# Patient Record
Sex: Male | Born: 1990 | Race: White | Hispanic: No | Marital: Single | Smoking: Never smoker
Health system: Southern US, Community
[De-identification: ages and names within clinical notes are randomized; demographics above are authoritative.]

## PROBLEM LIST (undated history)

## (undated) DIAGNOSIS — Z8619 Personal history of other infectious and parasitic diseases: Secondary | ICD-10-CM

## (undated) DIAGNOSIS — J45909 Unspecified asthma, uncomplicated: Secondary | ICD-10-CM

## (undated) HISTORY — DX: Unspecified asthma, uncomplicated: J45.909

## (undated) HISTORY — DX: Personal history of other infectious and parasitic diseases: Z86.19

---
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
## 2011-06-29 ENCOUNTER — Ambulatory Visit: Payer: Self-pay | Admitting: Internal Medicine

## 2012-06-12 ENCOUNTER — Ambulatory Visit: Payer: Self-pay | Admitting: Internal Medicine

## 2012-06-15 ENCOUNTER — Ambulatory Visit: Payer: Self-pay | Admitting: Internal Medicine

## 2012-10-04 ENCOUNTER — Ambulatory Visit: Payer: Self-pay | Admitting: Internal Medicine

## 2012-10-13 ENCOUNTER — Ambulatory Visit: Payer: Self-pay | Admitting: Internal Medicine

## 2013-01-01 ENCOUNTER — Ambulatory Visit (INDEPENDENT_AMBULATORY_CARE_PROVIDER_SITE_OTHER): Payer: BC Managed Care – PPO | Admitting: Internal Medicine

## 2013-01-01 ENCOUNTER — Other Ambulatory Visit (INDEPENDENT_AMBULATORY_CARE_PROVIDER_SITE_OTHER): Payer: BC Managed Care – PPO

## 2013-01-01 ENCOUNTER — Encounter: Payer: Self-pay | Admitting: Internal Medicine

## 2013-01-01 VITALS — BP 112/78 | HR 82 | Temp 98.5°F | Resp 16 | Ht 69.0 in | Wt 226.0 lb

## 2013-01-01 DIAGNOSIS — Z0000 Encounter for general adult medical examination without abnormal findings: Secondary | ICD-10-CM

## 2013-01-01 LAB — CBC WITH DIFFERENTIAL/PLATELET
Eosinophils Relative: 4 % (ref 0.0–5.0)
Hemoglobin: 15.6 g/dL (ref 13.0–17.0)
MCHC: 33.8 g/dL (ref 30.0–36.0)
Monocytes Relative: 9.1 % (ref 3.0–12.0)
Neutro Abs: 4.6 10*3/uL (ref 1.4–7.7)
Neutrophils Relative %: 60.4 % (ref 43.0–77.0)
Platelets: 284 10*3/uL (ref 150.0–400.0)
WBC: 7.7 10*3/uL (ref 4.5–10.5)

## 2013-01-01 LAB — URINALYSIS, ROUTINE W REFLEX MICROSCOPIC
Bilirubin Urine: NEGATIVE
Hgb urine dipstick: NEGATIVE
Ketones, ur: NEGATIVE
Nitrite: NEGATIVE
Total Protein, Urine: NEGATIVE
Urine Glucose: NEGATIVE
pH: 7 (ref 5.0–8.0)

## 2013-01-01 LAB — LIPID PANEL
Cholesterol: 148 mg/dL (ref 0–200)
HDL: 38.9 mg/dL — ABNORMAL LOW (ref >39.00)
LDL Cholesterol: 75 mg/dL (ref 0–99)
Total CHOL/HDL Ratio: 4
VLDL: 34.6 mg/dL (ref 0.0–40.0)

## 2013-01-01 LAB — COMPREHENSIVE METABOLIC PANEL
ALT: 48 U/L (ref 0–53)
AST: 27 U/L (ref 0–37)
Albumin: 4.6 g/dL (ref 3.5–5.2)
CO2: 28 mEq/L (ref 19–32)
GFR: 106.27 mL/min (ref >60.00)
Glucose, Bld: 108 mg/dL — ABNORMAL HIGH (ref 70–99)
Potassium: 3.8 mEq/L (ref 3.5–5.1)
Sodium: 139 mEq/L (ref 135–145)
Total Protein: 7.6 g/dL (ref 6.0–8.3)

## 2013-01-01 NOTE — Progress Notes (Signed)
Pre visit review using our clinic review tool, if applicable. No additional management support is needed unless otherwise documented below in the visit note. 

## 2013-01-01 NOTE — Progress Notes (Signed)
   Subjective:    Patient ID: Bradley Foley, male    DOB: [birth year], 22 y.o.   MRN: [id number]  HPI  New to me for a complete physical - he feels well and offers no complaints.  Review of Systems  All other systems reviewed and are negative.       Objective:   Physical Exam  Vitals reviewed. Constitutional: He is oriented to person, place, and time. He appears well-developed and well-nourished. No distress.  HENT:  Head: Normocephalic and atraumatic.  Mouth/Throat: Oropharynx is clear and moist. No oropharyngeal exudate.  Eyes: Conjunctivae are normal. Right eye exhibits no discharge. Left eye exhibits no discharge. No scleral icterus.  Neck: Normal range of motion. Neck supple. No JVD present. No tracheal deviation present. No thyromegaly present.  Cardiovascular: Normal rate, regular rhythm, normal heart sounds and intact distal pulses.  Exam reveals no gallop and no friction rub.   No murmur heard. Pulmonary/Chest: Effort normal and breath sounds normal. No stridor. No respiratory distress. He has no wheezes. He has no rales. He exhibits no tenderness.  Abdominal: Soft. Bowel sounds are normal. He exhibits no distension and no mass. There is no tenderness. There is no rebound and no guarding. Hernia confirmed negative in the right inguinal area and confirmed negative in the left inguinal area.  Genitourinary: Testes normal and penis normal. Right testis shows no mass, no swelling and no tenderness. Right testis is descended. Left testis shows no mass, no swelling and no tenderness. Left testis is descended. Circumcised. No penile erythema or penile tenderness. No discharge found.  Musculoskeletal: Normal range of motion. He exhibits no edema and no tenderness.  Lymphadenopathy:    He has no cervical adenopathy.       Right: No inguinal adenopathy present.       Left: No inguinal adenopathy present.  Neurological: He is oriented to person, place, and time.  Skin: Skin is warm  and dry. No rash noted. He is not diaphoretic. No erythema. No pallor.  Psychiatric: He has a normal mood and affect. His behavior is normal. Judgment and thought content normal.      No results found for this basename: WBC, HGB, HCT, PLT, GLUCOSE, CHOL, TRIG, HDL, LDLDIRECT, LDLCALC, ALT, AST, NA, K, CL, CREATININE, BUN, CO2, TSH, PSA, INR, GLUF, HGBA1C, MICROALBUR      Assessment & Plan:

## 2013-01-01 NOTE — Patient Instructions (Signed)
Health Maintenance, Males A healthy lifestyle and preventative care can promote health and wellness.  Maintain regular health, dental, and eye exams.  Eat a healthy diet. Foods like vegetables, fruits, whole grains, low-fat dairy products, and lean protein foods contain the nutrients you need without too many calories. Decrease your intake of foods high in solid fats, added sugars, and salt. Get information about a proper diet from your caregiver, if necessary.  Regular physical exercise is one of the most important things you can do for your health. Most adults should get at least 150 minutes of moderate-intensity exercise (any activity that increases your heart rate and causes you to sweat) each week. In addition, most adults need muscle-strengthening exercises on 2 or more days a week.   Maintain a healthy weight. The body mass index (BMI) is a screening tool to identify possible weight problems. It provides an estimate of body fat based on height and weight. Your caregiver can help determine your BMI, and can help you achieve or maintain a healthy weight. For adults 20 years and older:  A BMI below 18.5 is considered underweight.  A BMI of 18.5 to 24.9 is normal.  A BMI of 25 to 29.9 is considered overweight.  A BMI of 30 and above is considered obese.  Maintain normal blood lipids and cholesterol by exercising and minimizing your intake of saturated fat. Eat a balanced diet with plenty of fruits and vegetables. Blood tests for lipids and cholesterol should begin at age 20 and be repeated every 5 years. If your lipid or cholesterol levels are high, you are over 50, or you are a high risk for heart disease, you may need your cholesterol levels checked more frequently.Ongoing high lipid and cholesterol levels should be treated with medicines, if diet and exercise are not effective.  If you smoke, find out from your caregiver how to quit. If you do not use tobacco, do not start.  Lung  cancer screening is recommended for adults aged 55 80 years who are at high risk for developing lung cancer because of a history of smoking. Yearly low-dose computed tomography (CT) is recommended for people who have at least a 30-pack-year history of smoking and are a current smoker or have quit within the past 15 years. A pack year of smoking is smoking an average of 1 pack of cigarettes a day for 1 year (for example: 1 pack a day for 30 years or 2 packs a day for 15 years). Yearly screening should continue until the smoker has stopped smoking for at least 15 years. Yearly screening should also be stopped for people who develop a health problem that would prevent them from having lung cancer treatment.  If you choose to drink alcohol, do not exceed 2 drinks per day. One drink is considered to be 12 ounces (355 mL) of beer, 5 ounces (148 mL) of wine, or 1.5 ounces (44 mL) of liquor.  Avoid use of street drugs. Do not share needles with anyone. Ask for help if you need support or instructions about stopping the use of drugs.  High blood pressure causes heart disease and increases the risk of stroke. Blood pressure should be checked at least every 1 to 2 years. Ongoing high blood pressure should be treated with medicines if weight loss and exercise are not effective.  If you are 45 to 23 years old, ask your caregiver if you should take aspirin to prevent heart disease.  Diabetes screening involves taking a blood   sample to check your fasting blood sugar level. This should be done once every 3 years, after age 45, if you are within normal weight and without risk factors for diabetes. Testing should be considered at a younger age or be carried out more frequently if you are overweight and have at least 1 risk factor for diabetes.  Colorectal cancer can be detected and often prevented. Most routine colorectal cancer screening begins at the age of 50 and continues through age 75. However, your caregiver may  recommend screening at an earlier age if you have risk factors for colon cancer. On a yearly basis, your caregiver may provide home test kits to check for hidden blood in the stool. Use of a small camera at the end of a tube, to directly examine the colon (sigmoidoscopy or colonoscopy), can detect the earliest forms of colorectal cancer. Talk to your caregiver about this at age 50, when routine screening begins. Direct examination of the colon should be repeated every 5 to 10 years through age 75, unless early forms of pre-cancerous polyps or small growths are found.  Hepatitis C blood testing is recommended for all people born from 1945 through 1965 and any individual with known risks for hepatitis C.  Healthy men should no longer receive prostate-specific antigen (PSA) blood tests as part of routine cancer screening. Consult with your caregiver about prostate cancer screening.  Testicular cancer screening is not recommended for adolescents or adult males who have no symptoms. Screening includes self-exam, caregiver exam, and other screening tests. Consult with your caregiver about any symptoms you have or any concerns you have about testicular cancer.  Practice safe sex. Use condoms and avoid high-risk sexual practices to reduce the spread of sexually transmitted infections (STIs).  Use sunscreen. Apply sunscreen liberally and repeatedly throughout the day. You should seek shade when your shadow is shorter than you. Protect yourself by wearing long sleeves, pants, a wide-brimmed hat, and sunglasses year round, whenever you are outdoors.  Notify your caregiver of new moles or changes in moles, especially if there is a change in shape or color. Also notify your caregiver if a mole is larger than the size of a pencil eraser.  A one-time screening for abdominal aortic aneurysm (AAA) and surgical repair of large AAAs by sound wave imaging (ultrasonography) is recommended for ages 65 to 75 years who are  current or former smokers.  Stay current with your immunizations. Document Released: 06/26/2007 Document Revised: 04/24/2012 Document Reviewed: 05/25/2010 [employer] Patient Information 2014 [employer].  

## 2013-01-01 NOTE — Assessment & Plan Note (Signed)
Exam done Vaccines were reviewed Labs ordered Pt ed material was given 

## 2013-01-02 ENCOUNTER — Encounter: Payer: Self-pay | Admitting: Internal Medicine

## 2013-02-13 ENCOUNTER — Telehealth: Payer: Self-pay | Admitting: Internal Medicine

## 2013-02-13 NOTE — Telephone Encounter (Signed)
[facility] Forwarding 18pgs to Dr.Jones,Thomas

## 2013-05-31 ENCOUNTER — Telehealth: Payer: Self-pay | Admitting: *Deleted

## 2013-05-31 DIAGNOSIS — Z0000 Encounter for general adult medical examination without abnormal findings: Secondary | ICD-10-CM

## 2013-05-31 NOTE — Telephone Encounter (Signed)
Pt called requesting orders for lipids and glucose.  Please advise

## 2013-05-31 NOTE — Telephone Encounter (Signed)
done

## 2013-06-01 NOTE — Telephone Encounter (Signed)
Spoke with pt advised orders placed  

## 2013-06-05 ENCOUNTER — Other Ambulatory Visit (INDEPENDENT_AMBULATORY_CARE_PROVIDER_SITE_OTHER): Payer: Managed Care, Other (non HMO)

## 2013-06-05 DIAGNOSIS — Z0000 Encounter for general adult medical examination without abnormal findings: Secondary | ICD-10-CM

## 2013-06-05 LAB — BASIC METABOLIC PANEL
BUN: 16 mg/dL (ref 6–23)
CO2: 26 mEq/L (ref 19–32)
CREATININE: 1 mg/dL (ref 0.4–1.5)
Calcium: 9.3 mg/dL (ref 8.4–10.5)
Chloride: 107 mEq/L (ref 96–112)
GFR: 103.33 mL/min (ref >60.00)
Glucose, Bld: 101 mg/dL — ABNORMAL HIGH (ref 70–99)
Potassium: 4 mEq/L (ref 3.5–5.1)
SODIUM: 140 meq/L (ref 135–145)

## 2013-06-05 LAB — LIPID PANEL
CHOLESTEROL: 149 mg/dL (ref 0–200)
HDL: 35.7 mg/dL — ABNORMAL LOW (ref >39.00)
LDL Cholesterol: 74 mg/dL (ref 0–99)
Total CHOL/HDL Ratio: 4
Triglycerides: 197 mg/dL — ABNORMAL HIGH (ref 0.0–149.0)
VLDL: 39.4 mg/dL (ref 0.0–40.0)

## 2013-06-07 ENCOUNTER — Encounter: Payer: Self-pay | Admitting: Internal Medicine

## 2013-10-05 ENCOUNTER — Telehealth: Payer: Self-pay | Admitting: Internal Medicine

## 2013-10-05 NOTE — Telephone Encounter (Signed)
He needs to get checked out for this

## 2013-10-05 NOTE — Telephone Encounter (Signed)
Notified Debra with md response...Raechel Chute

## 2013-10-05 NOTE — Telephone Encounter (Signed)
Mom states patient has had lose bowel movements for two weeks.  He has to travel on business and has tried imodium and its not working.  Would like to know what he could do to self treat until he gets back in town for appointment.

## 2013-10-08 ENCOUNTER — Ambulatory Visit: Payer: Managed Care, Other (non HMO) | Admitting: Internal Medicine

## 2014-05-16 ENCOUNTER — Ambulatory Visit (INDEPENDENT_AMBULATORY_CARE_PROVIDER_SITE_OTHER): Payer: 59 | Admitting: Internal Medicine

## 2014-05-16 ENCOUNTER — Other Ambulatory Visit (INDEPENDENT_AMBULATORY_CARE_PROVIDER_SITE_OTHER): Payer: 59

## 2014-05-16 VITALS — BP 118/82 | HR 120 | Temp 103.0°F | Resp 20 | Ht 69.0 in | Wt 220.0 lb

## 2014-05-16 DIAGNOSIS — J039 Acute tonsillitis, unspecified: Secondary | ICD-10-CM

## 2014-05-16 LAB — CBC WITH DIFFERENTIAL/PLATELET
BASOS ABS: 0 10*3/uL (ref 0.0–0.1)
Basophils Relative: 0.3 % (ref 0.0–3.0)
EOS ABS: 0 10*3/uL (ref 0.0–0.7)
Eosinophils Relative: 0.1 % (ref 0.0–5.0)
HEMATOCRIT: 44.6 % (ref 39.0–52.0)
Hemoglobin: 15.6 g/dL (ref 13.0–17.0)
LYMPHS ABS: 1.5 10*3/uL (ref 0.7–4.0)
LYMPHS PCT: 9.2 % — AB (ref 12.0–46.0)
MCHC: 35 g/dL (ref 30.0–36.0)
MCV: 83.7 fl (ref 78.0–100.0)
Monocytes Absolute: 1.6 10*3/uL — ABNORMAL HIGH (ref 0.1–1.0)
Monocytes Relative: 9.5 % (ref 3.0–12.0)
NEUTROS ABS: 13.2 10*3/uL — AB (ref 1.4–7.7)
Neutrophils Relative %: 80.9 % — ABNORMAL HIGH (ref 43.0–77.0)
Platelets: 213 10*3/uL (ref 150.0–400.0)
RBC: 5.33 Mil/uL (ref 4.22–5.81)
RDW: 12.6 % (ref 11.5–15.5)
WBC: 16.3 10*3/uL — ABNORMAL HIGH (ref 4.0–10.5)

## 2014-05-16 MED ORDER — AZITHROMYCIN 500 MG PO TABS: 500.0000 mg | ORAL_TABLET | Freq: Every day | ORAL | Status: DC

## 2014-05-16 MED ORDER — METHYLPREDNISOLONE ACETATE 80 MG/ML IJ SUSP
120.0000 mg | Freq: Once | INTRAMUSCULAR | Status: AC
  Administered 2014-05-16: 120 mg via INTRAMUSCULAR

## 2014-05-16 NOTE — Patient Instructions (Signed)

## 2014-05-16 NOTE — Progress Notes (Signed)
Pre visit review using our clinic review tool, if applicable. No additional management support is needed unless otherwise documented below in the visit note. 

## 2014-05-17 ENCOUNTER — Telehealth: Payer: Self-pay | Admitting: Internal Medicine

## 2014-05-17 ENCOUNTER — Encounter: Payer: Self-pay | Admitting: Internal Medicine

## 2014-05-17 LAB — MONONUCLEOSIS SCREEN: MONO SCREEN: NEGATIVE

## 2014-05-17 NOTE — Telephone Encounter (Signed)
Left message to call back on patients voice mail

## 2014-05-17 NOTE — Telephone Encounter (Signed)
Spoke with patient and answered questions regarding lab results. Instructed to complete antibiotic and follow up if symptoms worsen or fail to continue to improve.

## 2014-05-17 NOTE — Telephone Encounter (Signed)
Tammy SoursGreg, This mother called in again.  He is a Jones pt.  Can you read his labs and i can call her if you need me too?

## 2014-05-17 NOTE — Telephone Encounter (Signed)
Patient mother is requesting results to be released to my chart and a call back in regards.  States was told results would be available to them last night.  States to call patient back at [phone].

## 2014-05-18 ENCOUNTER — Encounter: Payer: Self-pay | Admitting: Internal Medicine

## 2014-05-18 NOTE — Assessment & Plan Note (Signed)
RSS is negative CBC shows an elevated WBC with a slight left shift Mono spot is neg Will treat for viral tonsillitis with an injection of depo-medrol IM to reduce the swelling and will treat for bacterial causes with zithromax

## 2014-05-18 NOTE — Progress Notes (Signed)
Subjective:    Patient ID: Bradley HainesJoshua H Foley, male    DOB: 05/03/1990, 24 y.o.   MRN: [id number]  Sore Throat  This is a new problem. The current episode started yesterday. The problem has been unchanged. Neither side of throat is experiencing more pain than the other. The maximum temperature recorded prior to his arrival was 101 - 101.9 F. The fever has been present for less than 1 day. The pain is at a severity of 1/10. The pain is mild. Pertinent negatives include no abdominal pain, congestion, coughing, diarrhea, drooling, ear pain, headaches, hoarse voice, neck pain, shortness of breath, stridor, swollen glands, trouble swallowing or vomiting. He has had exposure to mono. He has tried acetaminophen for the symptoms. The treatment provided mild relief.      Review of Systems  Constitutional: Positive for chills and fatigue. Negative for fever, diaphoresis and appetite change.  HENT: Positive for sore throat. Negative for congestion, drooling, ear pain, hoarse voice, sinus pressure, trouble swallowing and voice change.   Eyes: Negative.   Respiratory: Negative.  Negative for cough, choking, chest tightness, shortness of breath and stridor.   Cardiovascular: Negative.  Negative for chest pain, palpitations and leg swelling.  Gastrointestinal: Negative.  Negative for vomiting, abdominal pain and diarrhea.  Endocrine: Negative.   Genitourinary: Negative.   Musculoskeletal: Negative.  Negative for neck pain.  Skin: Negative.  Negative for rash.  Allergic/Immunologic: Negative.   Neurological: Negative.  Negative for headaches.  Hematological: Negative.  Negative for adenopathy. Does not bruise/bleed easily.  Psychiatric/Behavioral: Negative.        Objective:   Physical Exam  Constitutional: He is oriented to person, place, and time. He appears well-developed and well-nourished.  Non-toxic appearance. He does not have a sickly appearance. He does not appear ill. No distress.  HENT:    Head: Normocephalic and atraumatic.  Right Ear: Hearing, tympanic membrane, external ear and ear canal normal.  Left Ear: Hearing, tympanic membrane, external ear and ear canal normal.  Mouth/Throat: Mucous membranes are normal. Mucous membranes are not pale, not dry and not cyanotic. No oral lesions. No trismus in the jaw. No uvula swelling. Oropharyngeal exudate and posterior oropharyngeal erythema present. No posterior oropharyngeal edema or tonsillar abscesses.  There is bilateral tonsillar hypertrophy but no asymmetry or bulging.  Eyes: Conjunctivae are normal. Right eye exhibits no discharge. Left eye exhibits no discharge. No scleral icterus.  Neck: Normal range of motion. Neck supple. No JVD present. No tracheal deviation present. No thyromegaly present.  Cardiovascular: Normal rate, regular rhythm, normal heart sounds and intact distal pulses.  Exam reveals no gallop and no friction rub.   No murmur heard. Pulmonary/Chest: Effort normal and breath sounds normal. No stridor. No respiratory distress. He has no wheezes. He has no rales. He exhibits no tenderness.  Abdominal: Soft. Bowel sounds are normal. He exhibits no distension and no mass. There is no tenderness. There is no rebound and no guarding.  Musculoskeletal: Normal range of motion. He exhibits no edema or tenderness.  Lymphadenopathy:    He has no cervical adenopathy.  Neurological: He is oriented to person, place, and time.  Skin: Skin is warm and dry. No rash noted. He is not diaphoretic. No erythema. No pallor.  Vitals reviewed.    Lab Results  Component Value Date   WBC 16.3 Repeated and verified X2.* 05/16/2014   HGB 15.6 05/16/2014   HCT 44.6 05/16/2014   PLT 213.0 05/16/2014   GLUCOSE 101* 06/05/2013  CHOL 149 06/05/2013   TRIG 197.0* 06/05/2013   HDL 35.70* 06/05/2013   LDLCALC 74 06/05/2013   ALT 48 01/01/2013   AST 27 01/01/2013   NA 140 06/05/2013   K 4.0 06/05/2013   CL 107 06/05/2013   CREATININE  1.0 06/05/2013   BUN 16 06/05/2013   CO2 26 06/05/2013   TSH 0.86 01/01/2013       Assessment & Plan:

## 2014-07-24 ENCOUNTER — Encounter: Payer: 59 | Admitting: Internal Medicine

## 2014-08-05 ENCOUNTER — Encounter: Payer: 59 | Admitting: Internal Medicine

## 2014-08-08 ENCOUNTER — Other Ambulatory Visit (INDEPENDENT_AMBULATORY_CARE_PROVIDER_SITE_OTHER): Payer: 59

## 2014-08-08 ENCOUNTER — Encounter: Payer: Self-pay | Admitting: Internal Medicine

## 2014-08-08 ENCOUNTER — Ambulatory Visit (INDEPENDENT_AMBULATORY_CARE_PROVIDER_SITE_OTHER): Payer: 59 | Admitting: Internal Medicine

## 2014-08-08 VITALS — BP 120/86 | HR 77 | Temp 98.1°F | Resp 16 | Ht 69.0 in | Wt 223.8 lb

## 2014-08-08 DIAGNOSIS — Z0000 Encounter for general adult medical examination without abnormal findings: Secondary | ICD-10-CM

## 2014-08-08 LAB — CBC WITH DIFFERENTIAL/PLATELET
BASOS PCT: 0.3 % (ref 0.0–3.0)
Basophils Absolute: 0 10*3/uL (ref 0.0–0.1)
EOS ABS: 0.1 10*3/uL (ref 0.0–0.7)
Eosinophils Relative: 1.1 % (ref 0.0–5.0)
HEMATOCRIT: 45.3 % (ref 39.0–52.0)
Hemoglobin: 15.7 g/dL (ref 13.0–17.0)
LYMPHS ABS: 2.2 10*3/uL (ref 0.7–4.0)
LYMPHS PCT: 30.7 % (ref 12.0–46.0)
MCHC: 34.7 g/dL (ref 30.0–36.0)
MCV: 86.1 fl (ref 78.0–100.0)
MONOS PCT: 6.6 % (ref 3.0–12.0)
Monocytes Absolute: 0.5 10*3/uL (ref 0.1–1.0)
Neutro Abs: 4.3 10*3/uL (ref 1.4–7.7)
Neutrophils Relative %: 61.3 % (ref 43.0–77.0)
Platelets: 234 10*3/uL (ref 150.0–400.0)
RBC: 5.26 Mil/uL (ref 4.22–5.81)
RDW: 12.9 % (ref 11.5–15.5)
WBC: 7.1 10*3/uL (ref 4.0–10.5)

## 2014-08-08 LAB — COMPREHENSIVE METABOLIC PANEL
ALT: 30 U/L (ref 0–53)
AST: 19 U/L (ref 0–37)
Albumin: 4.5 g/dL (ref 3.5–5.2)
Alkaline Phosphatase: 55 U/L (ref 39–117)
BUN: 15 mg/dL (ref 6–23)
CALCIUM: 9.4 mg/dL (ref 8.4–10.5)
CO2: 27 meq/L (ref 19–32)
CREATININE: 1.03 mg/dL (ref 0.40–1.50)
Chloride: 106 mEq/L (ref 96–112)
GFR: 93.22 mL/min (ref >60.00)
Glucose, Bld: 100 mg/dL — ABNORMAL HIGH (ref 70–99)
Potassium: 4 mEq/L (ref 3.5–5.1)
SODIUM: 141 meq/L (ref 135–145)
Total Bilirubin: 0.5 mg/dL (ref 0.2–1.2)
Total Protein: 7.5 g/dL (ref 6.0–8.3)

## 2014-08-08 LAB — LIPID PANEL
Cholesterol: 156 mg/dL (ref 0–200)
HDL: 39.7 mg/dL (ref >39.00)
LDL Cholesterol: 91 mg/dL (ref 0–99)
NonHDL: 116
TRIGLYCERIDES: 126 mg/dL (ref 0.0–149.0)
Total CHOL/HDL Ratio: 4
VLDL: 25.2 mg/dL (ref 0.0–40.0)

## 2014-08-08 LAB — TSH: TSH: 0.82 u[IU]/mL (ref 0.35–4.50)

## 2014-08-08 NOTE — Patient Instructions (Signed)

## 2014-08-08 NOTE — Progress Notes (Signed)
Subjective:  Patient ID: Bradley Foley, male    DOB: 05/19/1990  Age: 24 y.o. MRN: [id number]  CC: Annual Exam   HPI Bradley Foley presents for a complete physical-he feels well and offers no complaints.  Outpatient Prescriptions Prior to Visit  Medication Sig Dispense Refill  . azithromycin (ZITHROMAX) 500 MG tablet Take 1 tablet (500 mg total) by mouth daily. (Patient not taking: Reported on 08/08/2014) 3 tablet 0   No facility-administered medications prior to visit.    ROS Review of Systems  Constitutional: Negative.  Negative for fever, chills, diaphoresis and fatigue.  HENT: Negative.  Negative for sore throat and trouble swallowing.   Eyes: Negative.   Respiratory: Negative.   Cardiovascular: Negative.   Gastrointestinal: Negative.  Negative for abdominal pain.  Endocrine: Negative.   Genitourinary: Negative.  Negative for dysuria, urgency, frequency, hematuria, flank pain, penile swelling, difficulty urinating, genital sores, penile pain and testicular pain.  Musculoskeletal: Negative.   Skin: Negative.  Negative for rash.  Allergic/Immunologic: Negative.   Neurological: Negative.  Negative for dizziness, weakness and light-headedness.  Hematological: Negative.  Negative for adenopathy. Does not bruise/bleed easily.  Psychiatric/Behavioral: Negative.     Objective:  BP 120/86 mmHg  Pulse 77  Temp(Src) 98.1 F (36.7 C) (Oral)  Resp 16  Ht 5\' 9"  (1.753 m)  Wt 223 lb 12 oz (101.492 kg)  BMI 33.03 kg/m2  SpO2 96%  BP Readings from Last 3 Encounters:  08/08/14 120/86  05/16/14 118/82  01/01/13 112/78    Wt Readings from Last 3 Encounters:  08/08/14 223 lb 12 oz (101.492 kg)  05/16/14 220 lb (99.791 kg)  01/01/13 226 lb (102.513 kg)    Physical Exam  Constitutional: He is oriented to person, place, and time. He appears well-developed and well-nourished.  Non-toxic appearance. He does not have a sickly appearance. He does not appear ill. No distress.    HENT:  Head: Normocephalic and atraumatic.  Mouth/Throat: Oropharynx is clear and moist. No oropharyngeal exudate.  Eyes: Conjunctivae are normal. Right eye exhibits no discharge. Left eye exhibits no discharge. No scleral icterus.  Neck: Normal range of motion. Neck supple. No JVD present. No tracheal deviation present. No thyromegaly present.  Cardiovascular: Normal rate, regular rhythm, normal heart sounds and intact distal pulses.  Exam reveals no gallop and no friction rub.   No murmur heard. Pulmonary/Chest: Effort normal and breath sounds normal. No stridor. No respiratory distress. He has no wheezes. He has no rales. He exhibits no tenderness.  Abdominal: Soft. Bowel sounds are normal. He exhibits no distension and no mass. There is no tenderness. There is no rebound and no guarding. Hernia confirmed negative in the right inguinal area and confirmed negative in the left inguinal area.  Genitourinary: Testes normal and penis normal. Right testis shows no mass, no swelling and no tenderness. Right testis is descended. Left testis shows no mass, no swelling and no tenderness. Left testis is descended. Circumcised. No penile erythema or penile tenderness. No discharge found.  Musculoskeletal: Normal range of motion. He exhibits no edema or tenderness.  Lymphadenopathy:    He has no cervical adenopathy.       Right: No inguinal adenopathy present.       Left: No inguinal adenopathy present.  Neurological: He is oriented to person, place, and time.  Skin: Skin is warm and dry. No rash noted. He is not diaphoretic. No erythema. No pallor.  Psychiatric: He has a normal mood and affect. His  behavior is normal. Judgment and thought content normal.    Lab Results  Component Value Date   WBC 7.1 08/08/2014   HGB 15.7 08/08/2014   HCT 45.3 08/08/2014   PLT 234.0 08/08/2014   GLUCOSE 100* 08/08/2014   CHOL 156 08/08/2014   TRIG 126.0 08/08/2014   HDL 39.70 08/08/2014   LDLCALC 91 08/08/2014    ALT 30 08/08/2014   AST 19 08/08/2014   NA 141 08/08/2014   K 4.0 08/08/2014   CL 106 08/08/2014   CREATININE 1.03 08/08/2014   BUN 15 08/08/2014   CO2 27 08/08/2014   TSH 0.82 08/08/2014    No results found.  Assessment & Plan:   Jartavious was seen today for annual exam.  Diagnoses and all orders for this visit:  Routine general medical examination at a health care facility- exam done, vaccines reviewed, labs reviewed, patient education material was given. Orders: -     Lipid panel; Future -     Comprehensive metabolic panel; Future -     CBC with Differential/Platelet; Future -     TSH; Future   I have discontinued Mr. Lave azithromycin.  No orders of the defined types were placed in this encounter.     Follow-up: Return if symptoms worsen or fail to improve.  Sanda Linger, MD

## 2014-08-08 NOTE — Progress Notes (Signed)
Pre visit review using our clinic review tool, if applicable. No additional management support is needed unless otherwise documented below in the visit note. 

## 2014-11-02 NOTE — Addendum Note (Signed)
Addended by: Etta GrandchildJONES, Moneka Mcquinn L on: 11/02/2014 11:58 AM   Modules accepted: Kipp BroodSmartSet

## 2015-03-31 ENCOUNTER — Ambulatory Visit (HOSPITAL_BASED_OUTPATIENT_CLINIC_OR_DEPARTMENT_OTHER)
Admission: RE | Admit: 2015-03-31 | Discharge: 2015-03-31 | Disposition: A | Discharge status: Home / Self Care | Payer: BLUE CROSS/BLUE SHIELD | Source: Ambulatory Visit | Attending: Family Medicine | Admitting: Family Medicine

## 2015-03-31 ENCOUNTER — Ambulatory Visit (INDEPENDENT_AMBULATORY_CARE_PROVIDER_SITE_OTHER): Payer: BLUE CROSS/BLUE SHIELD | Admitting: Family Medicine

## 2015-03-31 ENCOUNTER — Encounter: Payer: Self-pay | Admitting: Family Medicine

## 2015-03-31 VITALS — BP 116/78 | HR 112 | Temp 102.5°F | Resp 20 | Wt 205.8 lb

## 2015-03-31 DIAGNOSIS — R918 Other nonspecific abnormal finding of lung field: Secondary | ICD-10-CM | POA: Diagnosis not present

## 2015-03-31 DIAGNOSIS — J209 Acute bronchitis, unspecified: Secondary | ICD-10-CM | POA: Diagnosis not present

## 2015-03-31 MED ORDER — DOXYCYCLINE HYCLATE 100 MG PO TABS: 100.0000 mg | ORAL_TABLET | Freq: Two times a day (BID) | ORAL | Status: AC

## 2015-03-31 NOTE — Progress Notes (Signed)
Patient ID: Bradley Foley, male   DOB: 10/07/1990, 25 y.o.   MRN: [id number]    Bradley Foley , 07/16/1990, 25 y.o., male MRN: [id number]  CC: cough  Subjective: Pt presents for an acute OV with complaints of productive cough, fever, nasal congestion, chills, nausea of 7 days duration. Pt states [place] stayed in [place] hostel in [place] week ago. The illness started with a scratchy throat. He travel back from [place] two days ago. He was sick prior to his travel home. Pt has tried nyquil/dayquil/robitussin, mucinex DM, advil  to ease their symptoms. He illness is worsening. He denies vomit or diarrhea.  UTD td and flu, he has a h/o of exercise induced asthma.   No Known Allergies Social History  Substance Use Topics  . Smoking status: Never Smoker   . Smokeless tobacco: Never Used  . Alcohol Use: No   Past Medical History  Diagnosis Date  . Asthma   . History of chicken pox    History reviewed. No pertinent past surgical history. Family History  Problem Relation Age of Onset  . Alcohol abuse Neg Hx   . Cancer Neg Hx   . Diabetes Neg Hx   . Drug abuse Neg Hx   . Early death Neg Hx   . Hearing loss Neg Hx   . Heart disease Neg Hx   . Hyperlipidemia Neg Hx   . Hypertension Neg Hx   . Kidney disease Neg Hx   . Stroke Neg Hx      Medication List    Notice  As of 03/31/2015  4:47 PM   You have not been prescribed any medications.     ROS: Negative, with the exception of above mentioned in HPI  Objective:  BP 116/78 mmHg  Pulse 112  Temp(Src) 102.5 F (39.2 C)  Resp 20  Wt 205 lb 12 oz (93.328 kg)  SpO2 96% Body mass index is 30.37 kg/(m^2). Gen: febrile. No acute distress. Fatigued appearing, well developed, well nourished male.  HENT: AT. [place]. Bilateral TM, right unable to visualize secondary to cerumen. Left with air fluid level, no erythema.  and normal in appearance. MMM, no oral lesions. Bilateral nares with erythema, drainage. Throat without erythema or exudates. Cough  present on exam, hoarseness present on exam.  Eyes:Pupils Equal Round Reactive to light, Extraocular movements intact,  Conjunctiva without redness, discharge or icterus. Neck/lymp/endocrine: Supple,right anterior cervical  lymphadenopathy CV: tachycardic Chest: CTAB, no wheeze or crackles. diminished lung sounds bilaterally.  Abd: Soft. NTND. BS present Skin: No  rashes, purpura or petechiae.  Neuro: Normal gait. PERLA. EOMi. Alert. Oriented x3   Assessment/Plan: Bradley HainesJoshua H Netherland is a 25 y.o. male present for acute OV 1. Acute bronchitis, unspecified organism - DG Chest 2 View; Future, concern for PNA with diminished lung sounds and high fever, fatigue.  - doxycycline (VIBRA-TABS) 100 MG tablet; Take 1 tablet (100 mg total) by mouth 2 (two) times daily.  Dispense: 20 tablet; Refill: 0  for  - rest, hydrate, doxycycline, mucinex DM, supportive therapy. - F/u dependent on CXR result.   electronically signed by:  Felix Pacinienee Kimila Papaleo, DO  [facility] [place]

## 2015-03-31 NOTE — Patient Instructions (Signed)
Flonase, Robitussin DM, advil/tylenol. CXR today, rule out pneumonia.  Doxycyline called in for you.   Acute Bronchitis Bronchitis is inflammation of the airways that extend from the windpipe into the lungs (bronchi). The inflammation often causes mucus to develop. This leads to a cough, which is the most common symptom of bronchitis.  In acute bronchitis, the condition usually develops suddenly and goes away over time, usually in a couple weeks. Smoking, allergies, and asthma can make bronchitis worse. Repeated episodes of bronchitis may cause further lung problems.  CAUSES Acute bronchitis is most often caused by the same virus that causes a cold. The virus can spread from person to person (contagious) through coughing, sneezing, and touching contaminated objects. SIGNS AND SYMPTOMS   Cough.   Fever.   Coughing up mucus.   Body aches.   Chest congestion.   Chills.   Shortness of breath.   Sore throat.  DIAGNOSIS  Acute bronchitis is usually diagnosed through a physical exam. Your health care provider will also ask you questions about your medical history. Tests, such as chest X-rays, are sometimes done to rule out other conditions.  TREATMENT  Acute bronchitis usually goes away in a couple weeks. Oftentimes, no medical treatment is necessary. Medicines are sometimes given for relief of fever or cough. Antibiotic medicines are usually not needed but may be prescribed in certain situations. In some cases, an inhaler may be recommended to help reduce shortness of breath and control the cough. A cool mist vaporizer may also be used to help thin bronchial secretions and make it easier to clear the chest.  HOME CARE INSTRUCTIONS  Get plenty of rest.   Drink enough fluids to keep your urine clear or pale yellow (unless you have a medical condition that requires fluid restriction). Increasing fluids may help thin your respiratory secretions (sputum) and reduce chest congestion,  and it will prevent dehydration.   Take medicines only as directed by your health care provider.  If you were prescribed an antibiotic medicine, finish it all even if you start to feel better.  Avoid smoking and secondhand smoke. Exposure to cigarette smoke or irritating chemicals will make bronchitis worse. If you are a smoker, consider using nicotine gum or skin patches to help control withdrawal symptoms. Quitting smoking will help your lungs heal faster.   Reduce the chances of another bout of acute bronchitis by washing your hands frequently, avoiding people with cold symptoms, and trying not to touch your hands to your mouth, nose, or eyes.   Keep all follow-up visits as directed by your health care provider.  SEEK MEDICAL CARE IF: Your symptoms do not improve after 1 week of treatment.  SEEK IMMEDIATE MEDICAL CARE IF:  You develop an increased fever or chills.   You have chest pain.   You have severe shortness of breath.  You have bloody sputum.   You develop dehydration.  You faint or repeatedly feel like you are going to pass out.  You develop repeated vomiting.  You develop a severe headache. MAKE SURE YOU:   Understand these instructions.  Will watch your condition.  Will get help right away if you are not doing well or get worse.   This information is not intended to replace advice given to you by your health care provider. Make sure you discuss any questions you have with your health care provider.   Document Released: 02/05/2004 Document Revised: 01/18/2014 Document Reviewed: 06/20/2012 [employer] Interactive Patient Education [employer]

## 2015-04-01 ENCOUNTER — Telehealth: Payer: Self-pay | Admitting: Family Medicine

## 2015-04-01 DIAGNOSIS — J209 Acute bronchitis, unspecified: Secondary | ICD-10-CM

## 2015-04-01 MED ORDER — PREDNISONE 20 MG PO TABS: ORAL_TABLET | ORAL | Status: AC

## 2015-04-01 NOTE — Telephone Encounter (Signed)
Patient was called to discuss chest x-ray. - his cxr is concerning for pneumonia/pneumonitis. I spoke with the pt and he is feeling much better today after abx start. Plans to fly back to [place], where he lives, on Friday. I encouraged him to only plan on doing this if his symptoms are completely resolved  by Friday. He was also advised to follow-up with his primary care physician in [place] for a repeat chest x-ray in 3-4 weeks to ensure complete resolution of abnormal findings. A prednisone taper was also prescribed.

## 2017-05-27 IMAGING — DX DG CHEST 2V
2 series · 2 of 2 positions shown · non-contrast
Comparison: None.

CLINICAL DATA: Acute bronchitis, fever and cough for 1 week,
possible pneumonia

EXAM:
CHEST  2 VIEW

[chest pa]
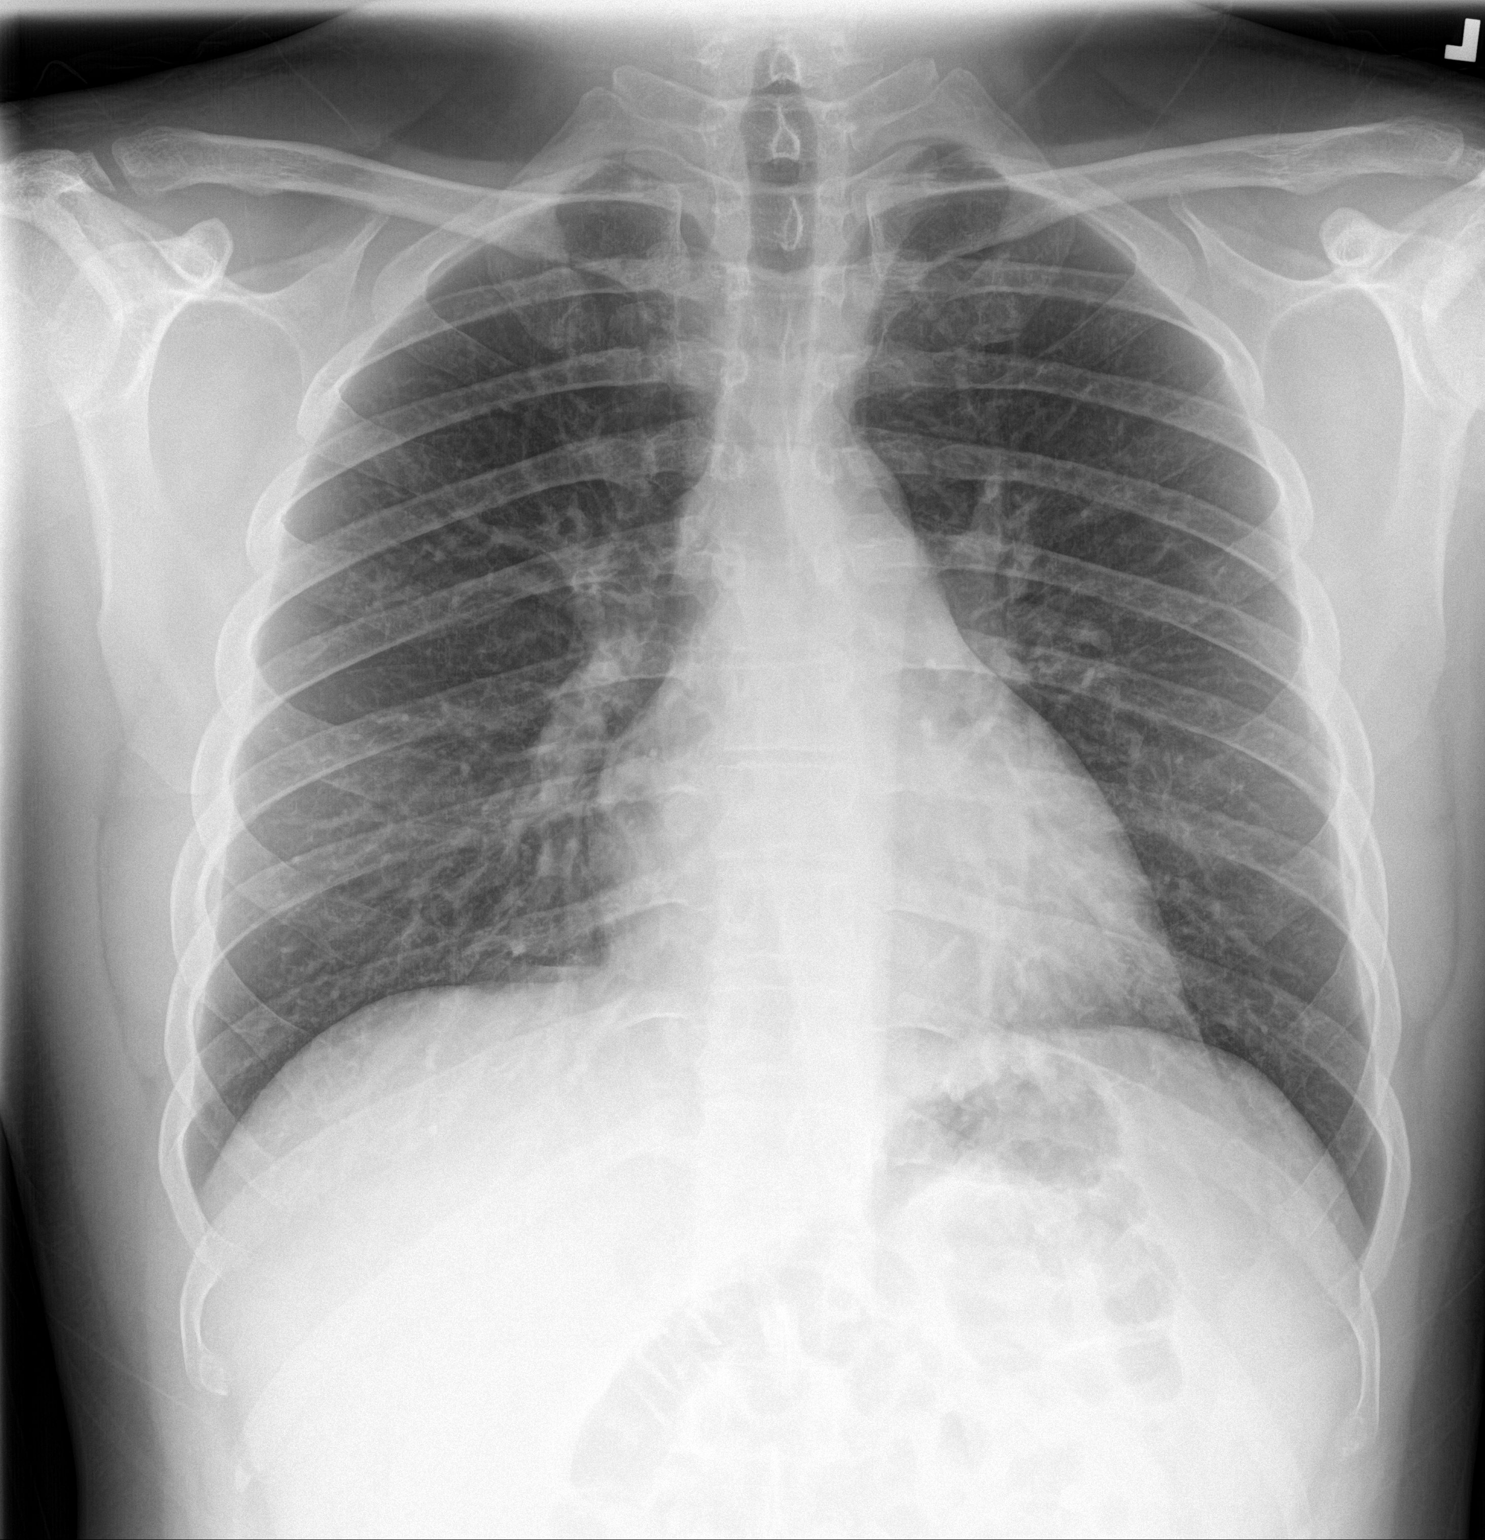

[chest lat]
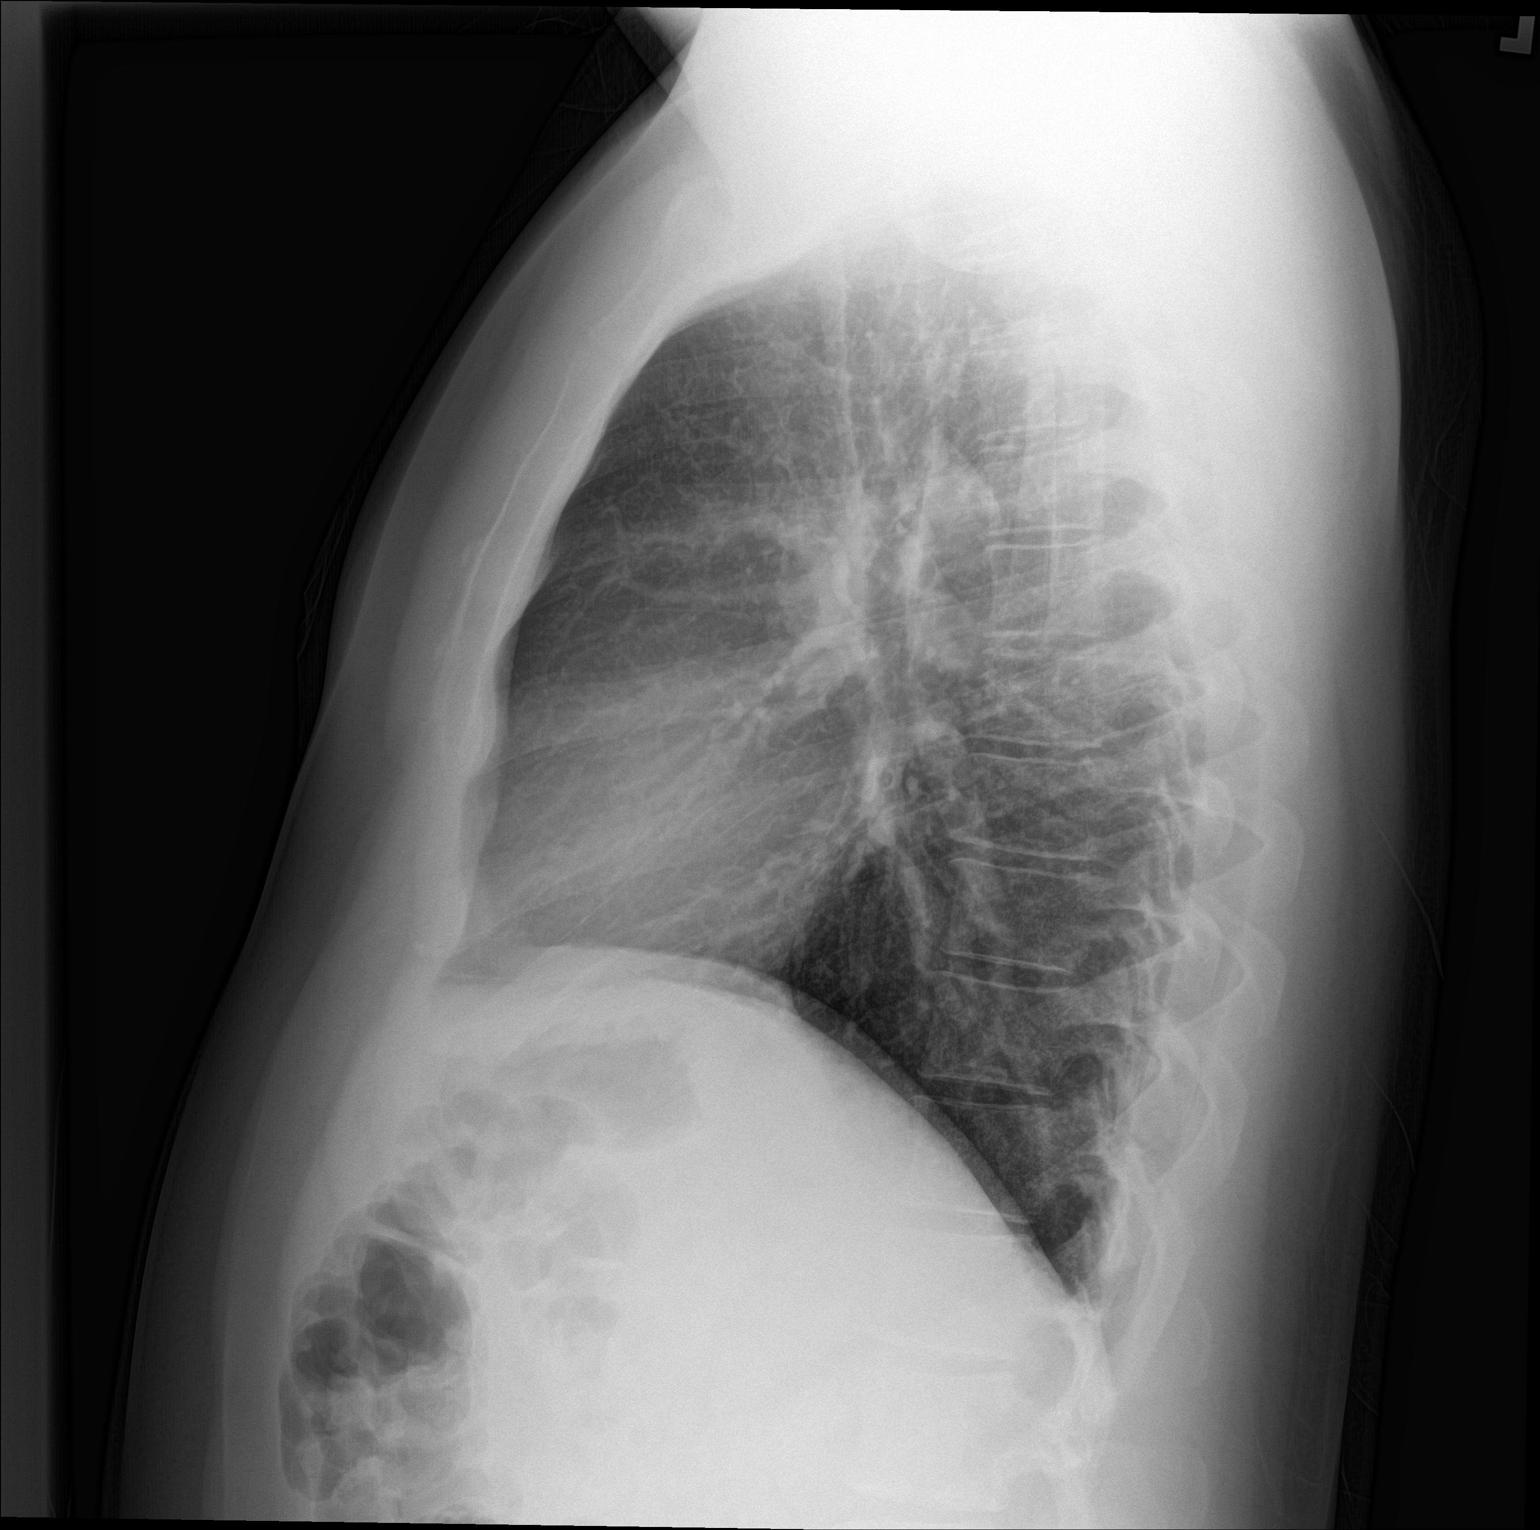

[2 of 2 positions shown; findings below may reference images not displayed]

FINDINGS: Cardiomediastinal silhouette is stable. Mild perihilar increased
bronchial markings. There is subtle reticular interstitial
prominence right upper lobe perihilar and right infrahilar region.
Atypical interstitial pneumonitis cannot be excluded. No convincing
pulmonary edema. No segmental consolidation.
IMPRESSION: Mild perihilar increased bronchial markings. There is subtle
reticular interstitial prominence right upper lobe perihilar and
right infrahilar region. Atypical interstitial pneumonitis cannot be
excluded. No convincing pulmonary edema. No segmental consolidation.
